# Patient Record
Sex: Male | Born: 1990 | Race: White | Hispanic: No | Marital: Single | Smoking: Current every day smoker
Health system: Southern US, Community
[De-identification: ages and names within clinical notes are randomized; demographics above are authoritative.]

## PROBLEM LIST (undated history)

## (undated) DIAGNOSIS — F1910 Other psychoactive substance abuse, uncomplicated: Secondary | ICD-10-CM

## (undated) HISTORY — DX: Other psychoactive substance abuse, uncomplicated: F19.10

---

## 2011-07-18 ENCOUNTER — Emergency Department (HOSPITAL_COMMUNITY): Payer: Self-pay

## 2011-07-18 ENCOUNTER — Emergency Department (HOSPITAL_COMMUNITY)
Admission: EM | Admit: 2011-07-18 | Discharge: 2011-07-18 | Disposition: A | Discharge status: Home / Self Care | Payer: Self-pay | Attending: Emergency Medicine | Admitting: Emergency Medicine

## 2011-07-18 DIAGNOSIS — R609 Edema, unspecified: Secondary | ICD-10-CM | POA: Insufficient documentation

## 2011-07-18 DIAGNOSIS — F17200 Nicotine dependence, unspecified, uncomplicated: Secondary | ICD-10-CM | POA: Insufficient documentation

## 2011-07-18 DIAGNOSIS — IMO0002 Reserved for concepts with insufficient information to code with codable children: Secondary | ICD-10-CM | POA: Insufficient documentation

## 2011-07-18 DIAGNOSIS — S60229A Contusion of unspecified hand, initial encounter: Secondary | ICD-10-CM | POA: Insufficient documentation

## 2011-07-18 DIAGNOSIS — M25549 Pain in joints of unspecified hand: Secondary | ICD-10-CM | POA: Insufficient documentation

## 2011-07-18 DIAGNOSIS — M79609 Pain in unspecified limb: Secondary | ICD-10-CM | POA: Insufficient documentation

## 2011-07-18 MED ORDER — IBUPROFEN 800 MG PO TABS: 800.0000 mg | ORAL_TABLET | Freq: Three times a day (TID) | ORAL | Status: AC

## 2011-07-18 NOTE — ED Notes (Signed)
Applied ace wrap to right hand. Patient tolerated well. Gave instructions on applying ace wrap as needed.

## 2011-07-18 NOTE — ED Notes (Signed)
Pt presents with right hand pain. Pt states he was working on his truck and he ran his hand into the motor.

## 2011-07-20 NOTE — ED Provider Notes (Signed)
History     CSN: [id number]  Arrival date & time 07/18/11  1825   First MD Initiated Contact with Patient 07/18/11 1839      Chief Complaint  Patient presents with  . Hand Injury    (Consider location/radiation/quality/duration/timing/severity/associated sxs/prior treatment) Patient is a 21 y.o. male presenting with hand injury. The history is provided by the patient.  Hand Injury  The incident occurred yesterday. The incident occurred at home. The injury mechanism was a direct blow (He was working on his truck yesterday,  a tool slipped and he hit the right hand against the motor.). The pain is present in the right hand. The quality of the pain is described as aching. The pain is at a severity of 8/10. The pain is moderate. The pain has been constant since the incident. Pertinent negatives include no fever. He reports no foreign bodies present. The symptoms are aggravated by movement and palpation. He has tried ice for the symptoms. The treatment provided no relief.    History reviewed. No pertinent past medical history.  History reviewed. No pertinent past surgical history.  No family history on file.  History  Substance Use Topics  . Smoking status: Current Everyday Smoker -- 0.5 packs/day  . Smokeless tobacco: Not on file  . Alcohol Use: No      Review of Systems  Constitutional: Negative for fever.  HENT: Negative.  Negative for sore throat.   Eyes: Negative.   Respiratory: Negative for shortness of breath.   Cardiovascular: Negative for chest pain.  Gastrointestinal: Negative for nausea and abdominal pain.  Genitourinary: Negative.   Musculoskeletal: Positive for arthralgias. Negative for joint swelling.  Skin: Negative.  Negative for rash and wound.  Neurological: Negative for weakness, numbness and headaches.  Hematological: Negative.   Psychiatric/Behavioral: Negative.     Allergies  Review of patient's allergies indicates no known allergies.  Home  Medications   Current Outpatient Rx  Name Route Sig Dispense Refill  . IBUPROFEN 800 MG PO TABS Oral Take 1 tablet (800 mg total) by mouth 3 (three) times daily. Take this medicine with food 21 tablet 0    BP 149/75  Pulse 87  Temp(Src) 97.7 F (36.5 C) (Oral)  Resp 18  Ht 6' (1.829 m)  Wt 200 lb (90.719 kg)  BMI 27.12 kg/m2  SpO2 100%  Physical Exam  Nursing note and vitals reviewed. Constitutional: He is oriented to person, place, and time. He appears well-developed and well-nourished.  HENT:  Head: Normocephalic and atraumatic.  Eyes: Conjunctivae are normal.  Neck: Normal range of motion.  Cardiovascular: Normal rate and intact distal pulses.   Pulmonary/Chest: Effort normal.  Musculoskeletal: Normal range of motion.       Hands: Neurological: He is alert and oriented to person, place, and time.  Skin: Skin is warm and dry.       Minimal edema noted dorsal mid hand over 5th mcp.  Psychiatric: He has a normal mood and affect.    ED Course  Procedures (including critical care time)  Labs Reviewed - No data to display Dg Hand Complete Right  07/18/2011  *RADIOLOGY REPORT*  Clinical Data: Pain post injury  RIGHT HAND - COMPLETE 3+ VIEW  Comparison: None.  Findings: Three views of the right hand submitted.  No acute fracture or subluxation.  No radiopaque foreign body.  IMPRESSION: No acute fracture or subluxation.  Original Report Authenticated By: Natasha Mead, M.D.     1. Hand contusion  MDM  Contusion of right hand.   Ace wrap,  Ibuprofen,  Ice.        Candis Musa, PA 07/20/11 1502

## 2011-07-21 NOTE — ED Provider Notes (Signed)
Medical screening examination/treatment/procedure(s) were performed by non-physician practitioner and as supervising physician I was immediately available for consultation/collaboration.  Nicoletta Dress. Colon Branch, MD 07/21/11 1527

## 2016-06-04 ENCOUNTER — Emergency Department (HOSPITAL_COMMUNITY)
Admission: EM | Admit: 2016-06-04 | Discharge: 2016-06-05 | Disposition: A | Discharge status: Home / Self Care | Payer: Self-pay | Attending: Emergency Medicine | Admitting: Emergency Medicine

## 2016-06-04 ENCOUNTER — Encounter (HOSPITAL_COMMUNITY): Payer: Self-pay | Admitting: Emergency Medicine

## 2016-06-04 DIAGNOSIS — F17210 Nicotine dependence, cigarettes, uncomplicated: Secondary | ICD-10-CM | POA: Insufficient documentation

## 2016-06-04 DIAGNOSIS — K0889 Other specified disorders of teeth and supporting structures: Secondary | ICD-10-CM

## 2016-06-04 DIAGNOSIS — K029 Dental caries, unspecified: Secondary | ICD-10-CM | POA: Insufficient documentation

## 2016-06-04 NOTE — ED Triage Notes (Signed)
Pt states he started having left jaw pain for 3 days

## 2016-06-04 NOTE — ED Provider Notes (Signed)
[facility]EM[facility]PT Provider Note   CSN: [id number] Arrival date & time: 06/04/16  2342     History   Chief Complaint Chief Complaint  Patient presents with  . Dental Pain    HPI Drew Hammond is a 25 y.o. male.  HPI   Drew Hammond is a 25 y.o. male who presents to the Emergency Department complaining of dental pain for nearly one week.  Pain increased for 3 days.  He reports pain to the left upper tooth that is worse with chewing and exposure to cold air.  He has been taking ibuprofen with minimal relief.  He has also been using an OTC temporary filling product. He denies fever, neck pain, facial swelling or difficulty swallowing.    History reviewed. No pertinent past medical history.  There are no active problems to display for this patient.   History reviewed. No pertinent surgical history.     Home Medications    Prior to Admission medications   Not on File    Family History History reviewed. No pertinent family history.  Social History Social History  Substance Use Topics  . Smoking status: Current Every Day Smoker    Packs/day: 0.50  . Smokeless tobacco: Former NeurosurgeonUser  . Alcohol use No     Allergies   Patient has no known allergies.   Review of Systems Review of Systems  Constitutional: Negative for appetite change and fever.  HENT: Positive for dental problem. Negative for congestion, facial swelling, sore throat and trouble swallowing.   Eyes: Negative for pain.  Musculoskeletal: Negative for neck pain and neck stiffness.  Neurological: Negative for dizziness, facial asymmetry and headaches.  Hematological: Negative for adenopathy.  All other systems reviewed and are negative.    Physical Exam Updated Vital Signs BP 145/90 (BP Location: Left Arm)   Pulse 77   Temp 98.3 F (36.8 C) (Oral)   Resp 18   Ht 6' (1.829 m)   Wt 88.5 kg   SpO2 100%   BMI 26.45 kg/m   Physical Exam  Constitutional: He is oriented to person, place,  and time. He appears well-developed and well-nourished. No distress.  HENT:  Head: Normocephalic and atraumatic.  Right Ear: Tympanic membrane and ear canal normal.  Left Ear: Tympanic membrane and ear canal normal.  Mouth/Throat: Uvula is midline, oropharynx is clear and moist and mucous membranes are normal. No trismus in the jaw. Dental caries present. No dental abscesses or uvula swelling.  Tenderness and dental caries of the left upper second molar.  No facial swelling, obvious dental abscess, trismus, or sublingual abnml.    Neck: Normal range of motion. Neck supple.  Cardiovascular: Normal rate, regular rhythm and normal heart sounds.   No murmur heard. Pulmonary/Chest: Effort normal and breath sounds normal.  Musculoskeletal: Normal range of motion.  Lymphadenopathy:    He has no cervical adenopathy.  Neurological: He is alert and oriented to person, place, and time. He exhibits normal muscle tone. Coordination normal.  Skin: Skin is warm and dry.  Nursing note and vitals reviewed.    ED Treatments / Results  Labs (all labs ordered are listed, but only abnormal results are displayed) Labs Reviewed - No data to display  EKG  EKG Interpretation None       Radiology No results found.  Procedures Procedures (including critical care time)  Medications Ordered in ED Medications - No data to display   Initial Impression / Assessment and Plan / ED Course  I have reviewed the triage vital signs and the nursing notes.  Pertinent labs & imaging results that were available during my care of the patient were reviewed by me and considered in my medical decision making (see chart for details).  Clinical Course     Pt is well appearing.  Vitals stable.  Airway patent.  No concerning sx's for dental abscess or Lawanda CousinsLudwig' angina.  Dental referral info given.  Pt agrees to arrange f/u.     Final Clinical Impressions(s) / ED Diagnoses   Final diagnoses:  Pain, dental     New Prescriptions New Prescriptions   No medications on file     Pauline Ausammy Yliana Gravois, PA-C 06/05/16 0007    Layla MawKristen N Ward, DO 06/05/16 [id number]

## 2016-06-05 MED ORDER — DICLOFENAC SODIUM 75 MG PO TBEC
75.0000 mg | DELAYED_RELEASE_TABLET | Freq: Two times a day (BID) | ORAL | 0 refills | Status: AC
Start: 2016-06-05 — End: ?

## 2016-06-05 MED ORDER — AMOXICILLIN 250 MG PO CAPS
500.0000 mg | ORAL_CAPSULE | Freq: Once | ORAL | Status: AC
  Administered 2016-06-05: 500 mg via ORAL
  Filled 2016-06-05: qty 2

## 2016-06-05 MED ORDER — AMOXICILLIN 500 MG PO CAPS: 500.0000 mg | ORAL_CAPSULE | Freq: Three times a day (TID) | ORAL | 0 refills | Status: DC

## 2016-06-05 MED ORDER — HYDROCODONE-ACETAMINOPHEN 5-325 MG PO TABS
1.0000 | ORAL_TABLET | Freq: Once | ORAL | Status: AC
  Administered 2016-06-05: 1 via ORAL
  Filled 2016-06-05: qty 1

## 2016-06-05 NOTE — Discharge Instructions (Signed)
Call one of the dentists on the list provided to arrange a follow-up appt.   °

## 2016-06-05 NOTE — ED Notes (Signed)
Pt states understanding of care given and follow up instructions.  Pt A/O x4, ambulated with steady gait from ED

## 2016-07-12 ENCOUNTER — Encounter (HOSPITAL_COMMUNITY): Payer: Self-pay

## 2016-07-12 ENCOUNTER — Emergency Department (HOSPITAL_COMMUNITY): Payer: Self-pay

## 2016-07-12 ENCOUNTER — Emergency Department (HOSPITAL_COMMUNITY)
Admission: EM | Admit: 2016-07-12 | Discharge: 2016-07-12 | Disposition: A | Discharge status: Home / Self Care | Payer: Self-pay | Attending: Emergency Medicine | Admitting: Emergency Medicine

## 2016-07-12 DIAGNOSIS — J3489 Other specified disorders of nose and nasal sinuses: Secondary | ICD-10-CM | POA: Insufficient documentation

## 2016-07-12 DIAGNOSIS — R51 Headache: Secondary | ICD-10-CM | POA: Insufficient documentation

## 2016-07-12 DIAGNOSIS — R509 Fever, unspecified: Secondary | ICD-10-CM | POA: Insufficient documentation

## 2016-07-12 DIAGNOSIS — R05 Cough: Secondary | ICD-10-CM | POA: Insufficient documentation

## 2016-07-12 DIAGNOSIS — M542 Cervicalgia: Secondary | ICD-10-CM | POA: Insufficient documentation

## 2016-07-12 DIAGNOSIS — R519 Headache, unspecified: Secondary | ICD-10-CM

## 2016-07-12 DIAGNOSIS — F17200 Nicotine dependence, unspecified, uncomplicated: Secondary | ICD-10-CM | POA: Insufficient documentation

## 2016-07-12 MED ORDER — CETIRIZINE-PSEUDOEPHEDRINE ER 5-120 MG PO TB12: 1.0000 | ORAL_TABLET | Freq: Two times a day (BID) | ORAL | 0 refills | Status: DC

## 2016-07-12 MED ORDER — HYDROCODONE-ACETAMINOPHEN 5-325 MG PO TABS: 1.0000 | ORAL_TABLET | ORAL | 0 refills | Status: DC | PRN

## 2016-07-12 MED ORDER — ONDANSETRON 8 MG PO TBDP
8.0000 mg | ORAL_TABLET | Freq: Once | ORAL | Status: AC
  Administered 2016-07-12: 8 mg via ORAL
  Filled 2016-07-12: qty 1

## 2016-07-12 MED ORDER — HYDROCODONE-ACETAMINOPHEN 5-325 MG PO TABS
1.0000 | ORAL_TABLET | Freq: Once | ORAL | Status: AC
  Administered 2016-07-12: 1 via ORAL
  Filled 2016-07-12: qty 1

## 2016-07-12 MED ORDER — KETOROLAC TROMETHAMINE 30 MG/ML IJ SOLN
30.0000 mg | Freq: Once | INTRAMUSCULAR | Status: AC
  Administered 2016-07-12: 30 mg via INTRAMUSCULAR
  Filled 2016-07-12: qty 1

## 2016-07-12 NOTE — ED Notes (Addendum)
Pt taken to xray 

## 2016-07-12 NOTE — ED Notes (Signed)
Pt taken to CT.

## 2016-07-12 NOTE — ED Triage Notes (Signed)
Patient reports of nasal congestion, cough, fever x1 week. States last night his head started to hurt and has not had any relief. Taken tylenol.

## 2016-07-12 NOTE — Discharge Instructions (Signed)
Take the medicines prescribed. Do not drive within 4 hours of taking hydrocodone as this medicine will make you sleepy.

## 2016-07-12 NOTE — ED Notes (Signed)
Pt returned from xray

## 2016-07-12 NOTE — ED Provider Notes (Signed)
[facility] Provider Note   CSN: [id number] Arrival date & time: 07/12/16  1548     History   Chief Complaint Chief Complaint  Patient presents with  . Headache  . Nasal Congestion    HPI Drew Hammond is a 25 y.o. male presenting with severe frontal headache, chills and subjective fever since last night.  He endorses having a one week history of nasal congestion with both clear rhinorrhea and intermittent thicker yellow discharge.  He denies epistaxis or bloody discharge.  He has had a nonproductive cough, denies shortness of breath, dizziness, vision changes but does endorse photophobia.  He denies prior history of headache.  He has become nauseous since here. He has neck and shoulder achy soreness, denies stiff neck.  He has taken tylenol without relief.  The history is provided by the patient.    History reviewed. No pertinent past medical history.  There are no active problems to display for this patient.   History reviewed. No pertinent surgical history.     Home Medications    Prior to Admission medications   Medication Sig Start Date End Date Taking? Authorizing Provider  amoxicillin (AMOXIL) 500 MG capsule Take 1 capsule (500 mg total) by mouth 3 (three) times daily. 06/05/16   Tammy Triplett, PA-C  cetirizine-pseudoephedrine (ZYRTEC-D) 5-120 MG tablet Take 1 tablet by mouth 2 (two) times daily. 07/12/16   Burgess Amor, PA-C  diclofenac (VOLTAREN) 75 MG EC tablet Take 1 tablet (75 mg total) by mouth 2 (two) times daily. Take with food 06/05/16   Tammy Triplett, PA-C  HYDROcodone-acetaminophen (NORCO/VICODIN) 5-325 MG tablet Take 1 tablet by mouth every 4 (four) hours as needed. 07/12/16   Burgess Amor, PA-C    Family History No family history on file.  Social History Social History  Substance Use Topics  . Smoking status: Current Every Day Smoker    Packs/day: 0.50  . Smokeless tobacco: Former Neurosurgeon  . Alcohol use No     Allergies   Patient has  no known allergies.   Review of Systems Review of Systems  Constitutional: Positive for chills and fever.  HENT: Positive for congestion and rhinorrhea. Negative for ear pain, sinus pressure, sore throat, trouble swallowing and voice change.   Eyes: Negative for discharge.  Respiratory: Positive for cough. Negative for shortness of breath, wheezing and stridor.   Cardiovascular: Negative for chest pain.  Gastrointestinal: Negative for abdominal pain.  Genitourinary: Negative.   Musculoskeletal: Positive for neck pain.  Skin: Negative.   Neurological: Positive for headaches. Negative for dizziness, speech difficulty and numbness.     Physical Exam Updated Vital Signs BP 121/64   Pulse 82   Temp 98.2 F (36.8 C) (Oral)   Resp 18   Ht 6' (1.829 m)   Wt 90.7 kg   SpO2 97%   BMI 27.12 kg/m   Physical Exam  Constitutional: He is oriented to person, place, and time. He appears well-developed and well-nourished.  HENT:  Head: Normocephalic and atraumatic.  Right Ear: Tympanic membrane and ear canal normal.  Left Ear: Tympanic membrane and ear canal normal.  Nose: Mucosal edema present. No rhinorrhea. No epistaxis.  Mouth/Throat: Uvula is midline, oropharynx is clear and moist and mucous membranes are normal. No oropharyngeal exudate, posterior oropharyngeal edema, posterior oropharyngeal erythema or tonsillar abscesses.  ttp bilateral forehead over frontal sinus.  No edema or erythema    Eyes: Conjunctivae and EOM are normal. Pupils are equal, round, and reactive to light.  Neck: Trachea normal and normal range of motion. Neck supple. Muscular tenderness present. No neck rigidity. Normal range of motion present.  Cardiovascular: Normal rate and normal heart sounds.   Pulmonary/Chest: Effort normal. No respiratory distress. He has no decreased breath sounds. He has wheezes in the right lower field. He has no rhonchi. He has no rales.  Occasional expiratory wheeze right base.    Abdominal: Soft. There is no tenderness.  Musculoskeletal: Normal range of motion.  Neurological: He is alert and oriented to person, place, and time. He has normal strength. No cranial nerve deficit or sensory deficit. Gait normal. GCS eye subscore is 4. GCS verbal subscore is 5. GCS motor subscore is 6.  Skin: Skin is warm and dry. No rash noted.  Psychiatric: He has a normal mood and affect.  Vitals reviewed.    ED Treatments / Results  Labs (all labs ordered are listed, but only abnormal results are displayed) Labs Reviewed - No data to display  EKG  EKG Interpretation None       Radiology Dg Chest 2 View  Result Date: 07/12/2016 CLINICAL DATA:  Productive cough and wheezing for 1 week.  Headache. EXAM: CHEST  2 VIEW COMPARISON:  None. FINDINGS: Heart and mediastinal contours are within normal limits. No focal opacities or effusions. No acute bony abnormality. IMPRESSION: No active cardiopulmonary disease. Electronically Signed   By: Charlett NoseKevin  Dover M.D.   On: 07/12/2016 16:41   Ct Maxillofacial Wo Contrast  Result Date: 07/12/2016 CLINICAL DATA:  Nasal congestion, cough, fever for 1 week. Frontal headache. EXAM: CT MAXILLOFACIAL WITHOUT CONTRAST TECHNIQUE: Multidetector CT imaging of the maxillofacial structures was performed. Multiplanar CT image reconstructions were also generated. A small metallic BB was placed on the right temple in order to reliably differentiate right from left. COMPARISON:  None. FINDINGS: Osseous: No fracture or mandibular dislocation. No destructive process. Orbits: Negative. No traumatic or inflammatory finding. Sinuses: Mild mucosal thickening in the maxillary sinuses and scattered ethmoid air cells. No air-fluid levels. Mastoid air cells are clear. Soft tissues: Normal Limited intracranial: Negative IMPRESSION: Mild chronic sinusitis changes in the maxillary sinuses and scattered ethmoid air cells. No acute sinusitis changes. No acute bony abnormality.  Electronically Signed   By: Charlett NoseKevin  Dover M.D.   On: 07/12/2016 18:31    Procedures Procedures (including critical care time)  Medications Ordered in ED Medications  ondansetron (ZOFRAN-ODT) disintegrating tablet 8 mg (8 mg Oral Given 07/12/16 1724)  HYDROcodone-acetaminophen (NORCO/VICODIN) 5-325 MG per tablet 1 tablet (1 tablet Oral Given 07/12/16 1724)  ketorolac (TORADOL) 30 MG/ML injection 30 mg (30 mg Intramuscular Given 07/12/16 1724)     Initial Impression / Assessment and Plan / ED Course  I have reviewed the triage vital signs and the nursing notes.  Pertinent labs & imaging results that were available during my care of the patient were reviewed by me and considered in my medical decision making (see chart for details).  Clinical Course     Suspect viral uri with sinus headache, exam not c/w meningitis. nonfocal neuro exam.  Ct maxillofacial obtained given patients discomfort level and persistence of sx.  No acute sinusitis.  He was given hydrocodone and toradol injection here with improvement in sx.  Advised continue anti inflammatory, also prescribed antihistamine/decongestant, few hydrocodone tablets for pain relief.  Discussed warm compresses, steam tx. Recheck by pcp if sx persist.  The patient appears reasonably screened and/or stabilized for discharge and I doubt any other medical condition or other [facility]  requiring further screening, evaluation, or treatment in the ED at this time prior to discharge.   Final Clinical Impressions(s) / ED Diagnoses   Final diagnoses:  Sinus headache    New Prescriptions Discharge Medication List as of 07/12/2016  8:06 PM    START taking these medications   Details  cetirizine-pseudoephedrine (ZYRTEC-D) 5-120 MG tablet Take 1 tablet by mouth 2 (two) times daily., Starting Wed 07/12/2016, Print    HYDROcodone-acetaminophen (NORCO/VICODIN) 5-325 MG tablet Take 1 tablet by mouth every 4 (four) hours as needed., Starting Wed 07/12/2016,  Print         Burgess AmorJulie Cynthia Stainback, PA-C 07/13/16 1358    Vanetta MuldersScott Zackowski, MD 07/17/16 1659

## 2016-07-12 NOTE — ED Notes (Signed)
Returned from ct 

## 2016-07-13 ENCOUNTER — Emergency Department (HOSPITAL_COMMUNITY)
Admission: EM | Admit: 2016-07-13 | Discharge: 2016-07-13 | Disposition: A | Discharge status: Home / Self Care | Payer: Self-pay | Attending: Emergency Medicine | Admitting: Emergency Medicine

## 2016-07-13 ENCOUNTER — Encounter (HOSPITAL_COMMUNITY): Payer: Self-pay | Admitting: *Deleted

## 2016-07-13 DIAGNOSIS — R112 Nausea with vomiting, unspecified: Secondary | ICD-10-CM | POA: Insufficient documentation

## 2016-07-13 DIAGNOSIS — R51 Headache: Secondary | ICD-10-CM | POA: Insufficient documentation

## 2016-07-13 DIAGNOSIS — R0981 Nasal congestion: Secondary | ICD-10-CM | POA: Insufficient documentation

## 2016-07-13 DIAGNOSIS — R519 Headache, unspecified: Secondary | ICD-10-CM

## 2016-07-13 DIAGNOSIS — F17200 Nicotine dependence, unspecified, uncomplicated: Secondary | ICD-10-CM | POA: Insufficient documentation

## 2016-07-13 DIAGNOSIS — H53149 Visual discomfort, unspecified: Secondary | ICD-10-CM | POA: Insufficient documentation

## 2016-07-13 MED ORDER — METOCLOPRAMIDE HCL 5 MG/ML IJ SOLN
10.0000 mg | Freq: Once | INTRAMUSCULAR | Status: AC
  Administered 2016-07-13: 10 mg via INTRAMUSCULAR
  Filled 2016-07-13: qty 2

## 2016-07-13 MED ORDER — DIPHENHYDRAMINE HCL 25 MG PO CAPS
25.0000 mg | ORAL_CAPSULE | Freq: Once | ORAL | Status: AC
  Administered 2016-07-13: 25 mg via ORAL
  Filled 2016-07-13: qty 1

## 2016-07-13 MED ORDER — KETOROLAC TROMETHAMINE 60 MG/2ML IM SOLN
60.0000 mg | Freq: Once | INTRAMUSCULAR | Status: AC
  Administered 2016-07-13: 60 mg via INTRAMUSCULAR
  Filled 2016-07-13: qty 2

## 2016-07-13 NOTE — ED Provider Notes (Signed)
[facility]-EMERGENCY DEPT Provider Note   CSN: [id number] Arrival date & time: 07/13/16  0734     History   Chief Complaint Chief Complaint  Patient presents with  . Headache    HPI Drew Hammond is a 25 y.o. male.  HPI  Drew Hammond is a 25 y.o. male who presents to the Emergency Department complaining of continued frontal headache.  He reports having a headache of gradual onset beginning two days ago.  He was seen here one day prior to this visit and treated with medication.  He reports headache had improved, but returned shortly after being discharged.  He returns complaining of throbbing pain to his forehead, temples and behind his eyes.  Pain associated with nasal congestion, bright light, nausea and vomiting.  He denies neck stiffness, fever, dizziness.  He has taken tylenol without relief. He was prescribed medications on the previous visit, but has not gotten them filled.    History reviewed. No pertinent past medical history.  There are no active problems to display for this patient.   History reviewed. No pertinent surgical history.     Home Medications    Prior to Admission medications   Medication Sig Start Date End Date Taking? Authorizing Provider  amoxicillin (AMOXIL) 500 MG capsule Take 1 capsule (500 mg total) by mouth 3 (three) times daily. 06/05/16   Alijah Akram, PA-C  cetirizine-pseudoephedrine (ZYRTEC-D) 5-120 MG tablet Take 1 tablet by mouth 2 (two) times daily. 07/12/16   Burgess AmorJulie Idol, PA-C  diclofenac (VOLTAREN) 75 MG EC tablet Take 1 tablet (75 mg total) by mouth 2 (two) times daily. Take with food 06/05/16   Calder Oblinger, PA-C  HYDROcodone-acetaminophen (NORCO/VICODIN) 5-325 MG tablet Take 1 tablet by mouth every 4 (four) hours as needed. 07/12/16   Burgess AmorJulie Idol, PA-C    Family History No family history on file.  Social History Social History  Substance Use Topics  . Smoking status: Current Every Day Smoker    Packs/day: 0.50  . Smokeless  tobacco: Former NeurosurgeonUser  . Alcohol use No     Allergies   Patient has no known allergies.   Review of Systems Review of Systems  Constitutional: Negative for activity change, appetite change and fever.  HENT: Negative for facial swelling and trouble swallowing.   Eyes: Positive for photophobia. Negative for pain and visual disturbance.  Respiratory: Negative for shortness of breath.   Cardiovascular: Negative for chest pain.  Gastrointestinal: Negative for nausea and vomiting.  Musculoskeletal: Negative for neck pain and neck stiffness.  Skin: Negative for rash and wound.  Neurological: Positive for headaches. Negative for dizziness, facial asymmetry, speech difficulty, weakness and numbness.  Psychiatric/Behavioral: Negative for confusion and decreased concentration.  All other systems reviewed and are negative.    Physical Exam Updated Vital Signs BP 138/81 (BP Location: Left Arm)   Pulse 70   Temp 98.1 F (36.7 C) (Oral)   Resp 18   Ht 6' (1.829 m)   Wt 90.7 kg   SpO2 100%   BMI 27.12 kg/m   Physical Exam  Constitutional: He is oriented to person, place, and time. He appears well-developed and well-nourished. No distress.  HENT:  Head: Normocephalic and atraumatic.  Nose: Mucosal edema present. Right sinus exhibits frontal sinus tenderness. Left sinus exhibits frontal sinus tenderness.  Mouth/Throat: Uvula is midline, oropharynx is clear and moist and mucous membranes are normal.  Eyes: EOM are normal. Pupils are equal, round, and reactive to light.  Neck: Normal range  of motion and phonation normal. Neck supple. No spinous process tenderness and no muscular tenderness present. No neck rigidity. No Kernig's sign noted.  Cardiovascular: Normal rate, regular rhythm and intact distal pulses.   No murmur heard. Pulmonary/Chest: Effort normal and breath sounds normal. No respiratory distress.  Musculoskeletal: Normal range of motion.  Neurological: He is alert and  oriented to person, place, and time. He has normal strength. No cranial nerve deficit or sensory deficit. He exhibits normal muscle tone. Coordination and gait normal. GCS eye subscore is 4. GCS verbal subscore is 5. GCS motor subscore is 6.  Reflex Scores:      Tricep reflexes are 2+ on the right side and 2+ on the left side.      Bicep reflexes are 2+ on the right side and 2+ on the left side. Skin: Skin is warm and dry.  Psychiatric: He has a normal mood and affect.  Nursing note and vitals reviewed.    ED Treatments / Results  Labs (all labs ordered are listed, but only abnormal results are displayed) Labs Reviewed - No data to display  EKG  EKG Interpretation None       Radiology Dg Chest 2 View  Result Date: 07/12/2016 CLINICAL DATA:  Productive cough and wheezing for 1 week.  Headache. EXAM: CHEST  2 VIEW COMPARISON:  None. FINDINGS: Heart and mediastinal contours are within normal limits. No focal opacities or effusions. No acute bony abnormality. IMPRESSION: No active cardiopulmonary disease. Electronically Signed   By: Charlett NoseKevin  Dover M.D.   On: 07/12/2016 16:41   Ct Maxillofacial Wo Contrast  Result Date: 07/12/2016 CLINICAL DATA:  Nasal congestion, cough, fever for 1 week. Frontal headache. EXAM: CT MAXILLOFACIAL WITHOUT CONTRAST TECHNIQUE: Multidetector CT imaging of the maxillofacial structures was performed. Multiplanar CT image reconstructions were also generated. A small metallic BB was placed on the right temple in order to reliably differentiate right from left. COMPARISON:  None. FINDINGS: Osseous: No fracture or mandibular dislocation. No destructive process. Orbits: Negative. No traumatic or inflammatory finding. Sinuses: Mild mucosal thickening in the maxillary sinuses and scattered ethmoid air cells. No air-fluid levels. Mastoid air cells are clear. Soft tissues: Normal Limited intracranial: Negative IMPRESSION: Mild chronic sinusitis changes in the maxillary  sinuses and scattered ethmoid air cells. No acute sinusitis changes. No acute bony abnormality. Electronically Signed   By: Charlett NoseKevin  Dover M.D.   On: 07/12/2016 18:31    Procedures Procedures (including critical care time)  Medications Ordered in ED Medications  ketorolac (TORADOL) injection 60 mg (60 mg Intramuscular Given 07/13/16 0928)  metoCLOPramide (REGLAN) injection 10 mg (10 mg Intramuscular Given 07/13/16 0929)  diphenhydrAMINE (BENADRYL) capsule 25 mg (25 mg Oral Given 07/13/16 [id number])     Initial Impression / Assessment and Plan / ED Course  I have reviewed the triage vital signs and the nursing notes.  Pertinent labs & imaging results that were available during my care of the patient were reviewed by me and considered in my medical decision making (see chart for details).  Clinical Course    Well appearing, non-toxic.  No nuchal rigidity.  CT maxillofacial from yesterday neg for acute sinusitis.    Pt is feeling better after medications.  Drinking fluids and sleeping on recheck.  Easily aroused.  Reports pain improved.  Agrees to get rx filled today  Final Clinical Impressions(s) / ED Diagnoses   Final diagnoses:  Sinus headache    New Prescriptions New Prescriptions   No medications on file  Pauline Aus, PA-C 07/14/16 2126    Donnetta Hutching, MD 07/17/16 [phone]

## 2016-07-13 NOTE — Discharge Instructions (Signed)
Try to get your medications filled today.  Drink plenty of fluids.  Follow-up with your doctor for recheck if needed.

## 2016-07-13 NOTE — ED Notes (Signed)
Patient given discharge instruction, verbalized understand. Patient ambulatory out of the department.  

## 2016-07-13 NOTE — ED Triage Notes (Signed)
Pt comes in with headache starting 2 days ago. Pt has had n/v with this. Was seen here last night and discharged. States on the way home his head began hurting again. NAD noted.

## 2017-09-25 IMAGING — CT CT MAXILLOFACIAL W/O CM
3 series · 16 of 47 positions shown, 19 images · non-contrast
Comparison: None.

CLINICAL DATA: Nasal congestion, cough, fever for 1 week. Frontal
headache.

EXAM:
CT MAXILLOFACIAL WITHOUT CONTRAST
TECHNIQUE: Multidetector CT imaging of the maxillofacial structures was
performed. Multiplanar CT image reconstructions were also generated.
A small metallic BB was placed on the right temple in order to
reliably differentiate right from left.

[Series 2: max soft · axial · 0.34mm/px · z∈[+921,+1071]mm · 10 of 87 slices shown, 13 images]
[im 6/87  brain]
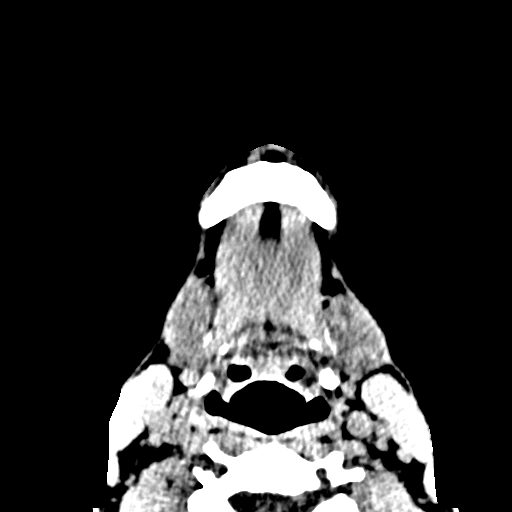
[im 6/87  bone]
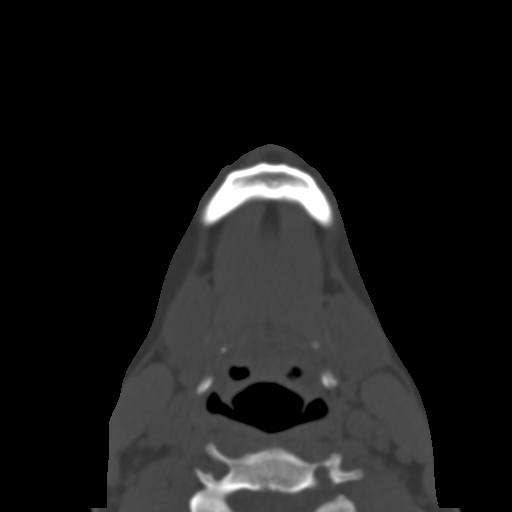
[im 15/87  bone]
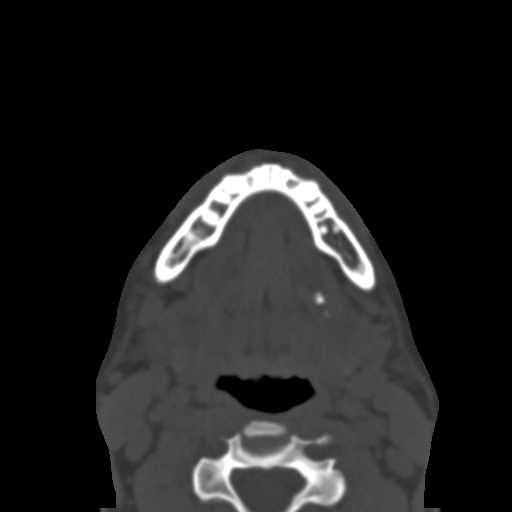
[im 24/87  bone]
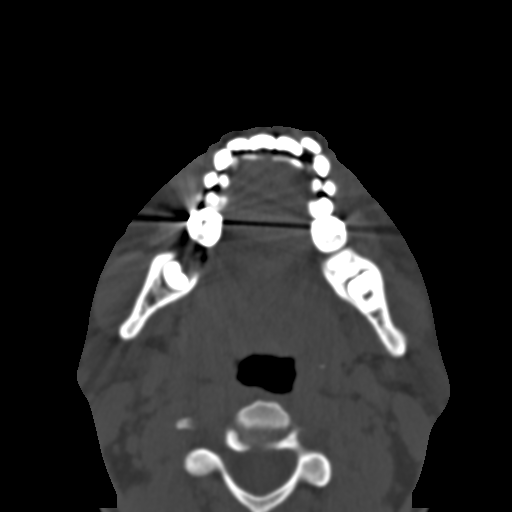
[im 30/87  bone]
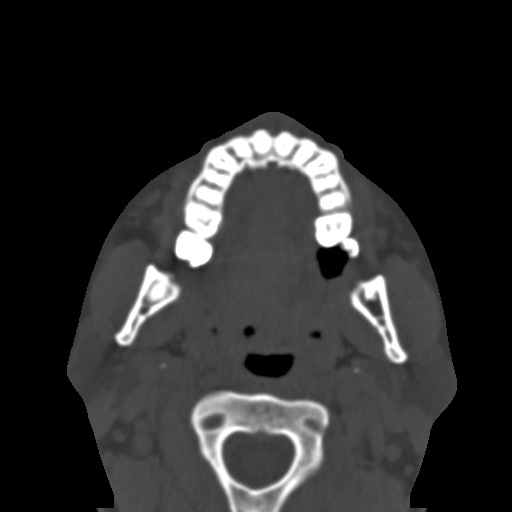
[im 39/87  brain]
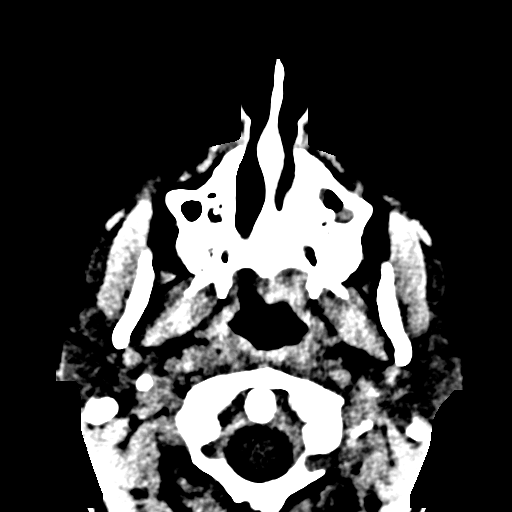
[im 39/87  bone]
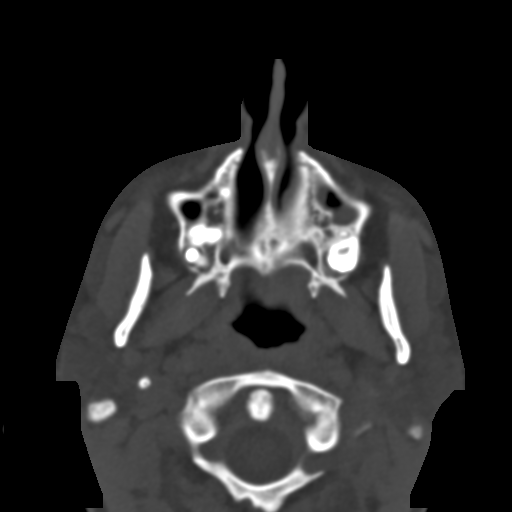
[im 48/87  bone]
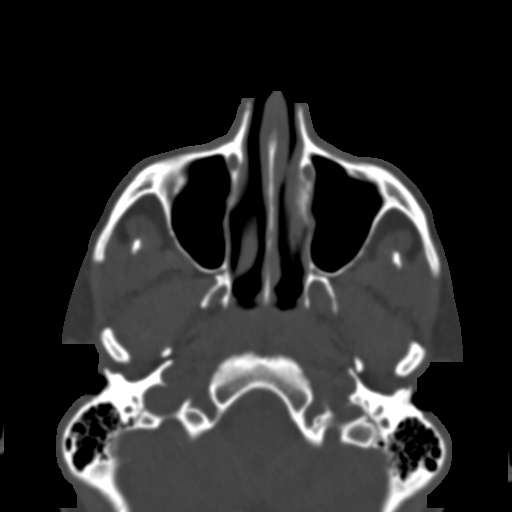
[im 57/87  bone]
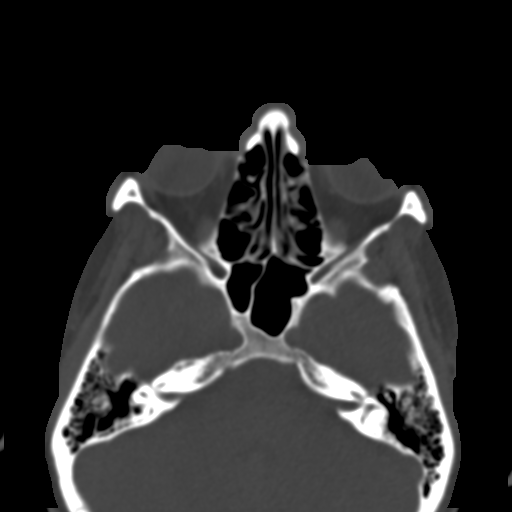
[im 66/87  bone]
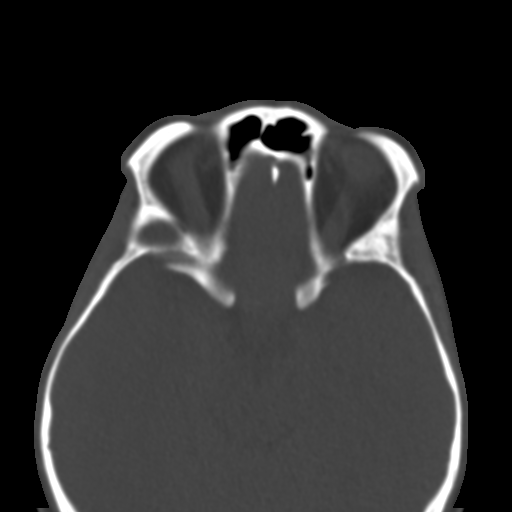
[im 72/87  brain]
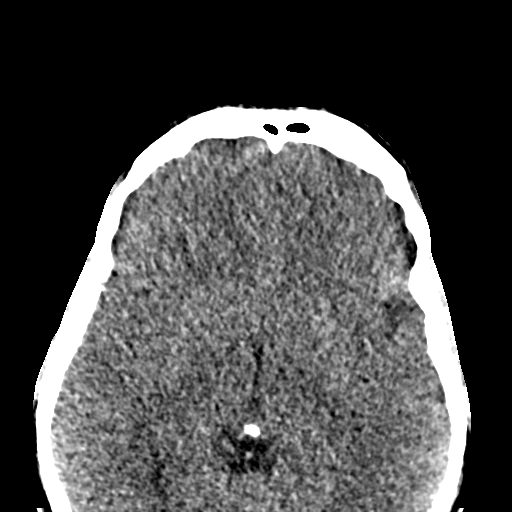
[im 72/87  bone]
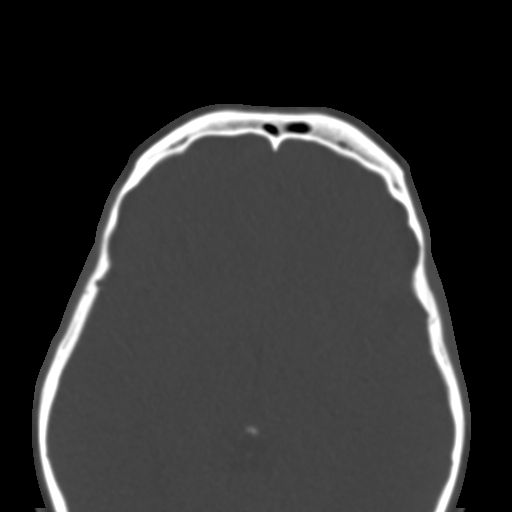
[im 81/87  bone]
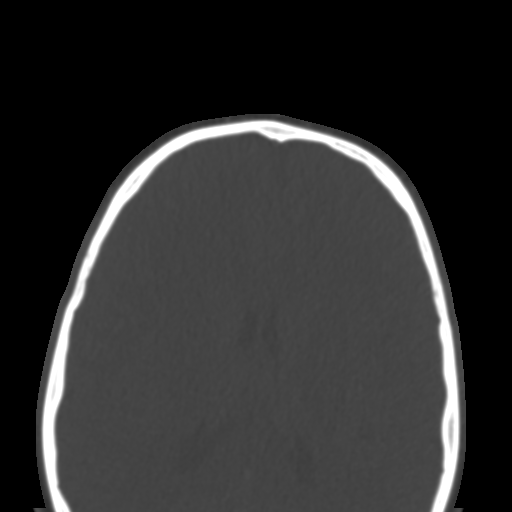

[Series 6: coronal soft · coronal · 0.31mm/px · 3 of 72 slices shown]
[im 24/72  bone]
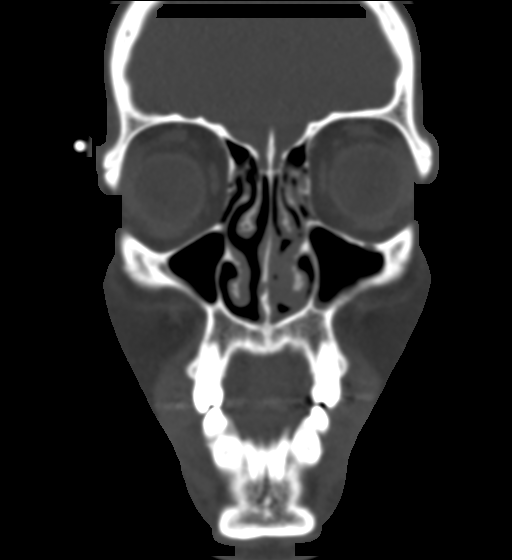
[im 32/72  bone]
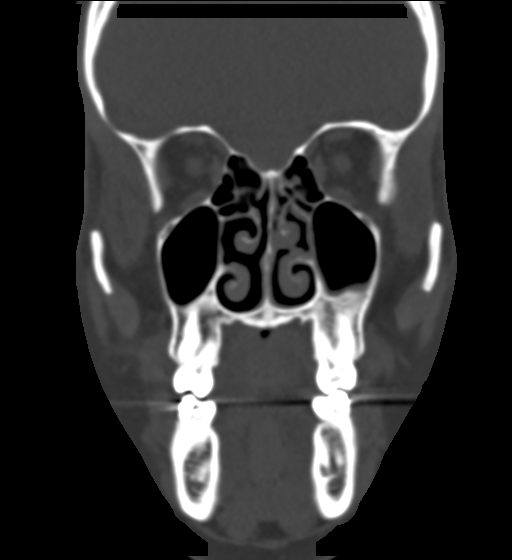
[im 40/72  bone]
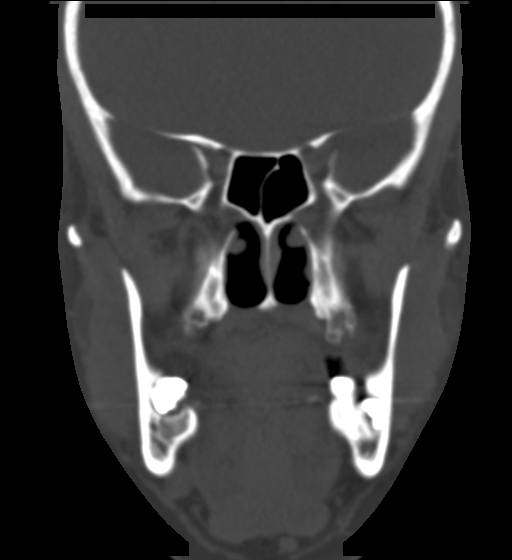

[Series 7: sagittal soft · sagittal · 0.31mm/px · 3 of 78 slices shown]
[im 26/78  bone]
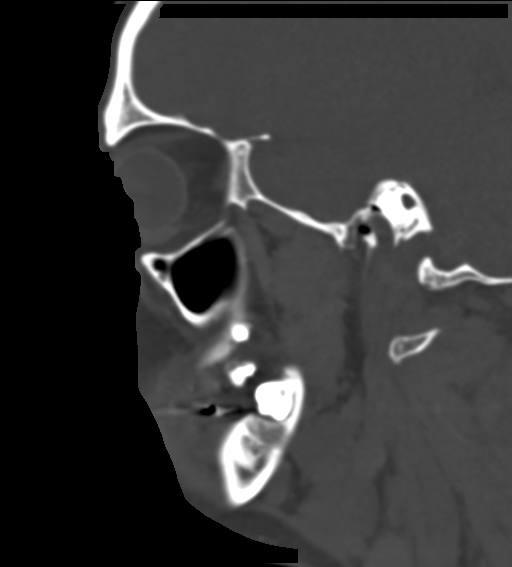
[im 39/78  bone]
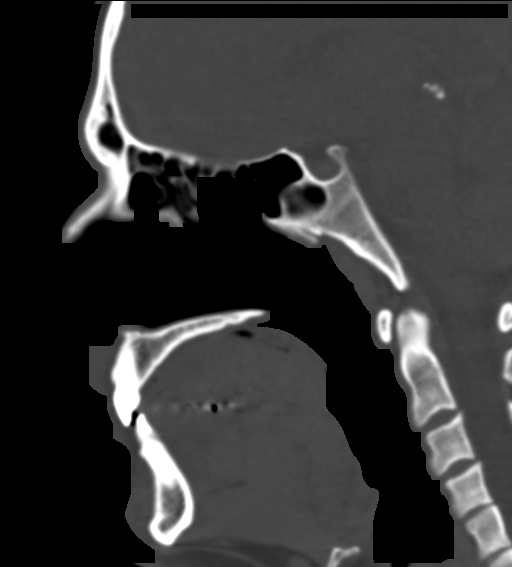
[im 52/78  bone]
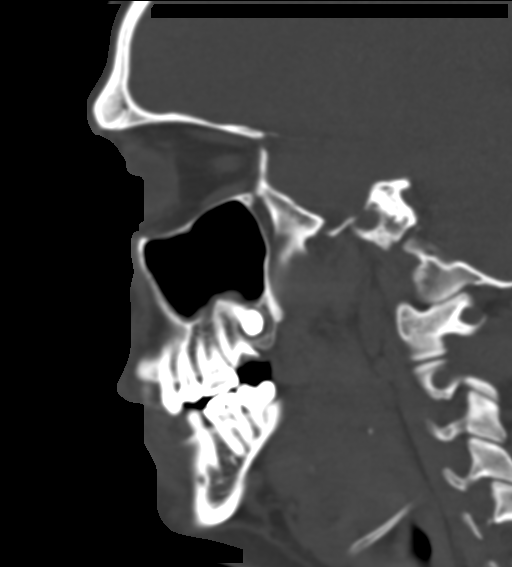

[16 of 47 positions shown; findings below may reference images not displayed]

FINDINGS: Osseous: No fracture or mandibular dislocation. No destructive
process.

Orbits: Negative. No traumatic or inflammatory finding.

Sinuses: Mild mucosal thickening in the maxillary sinuses and
scattered ethmoid air cells. No air-fluid levels. Mastoid air cells
are clear.

Soft tissues: Normal

Limited intracranial: Negative
IMPRESSION: Mild chronic sinusitis changes in the maxillary sinuses and
scattered ethmoid air cells. No acute sinusitis changes.

No acute bony abnormality.

## 2017-09-25 IMAGING — DX DG CHEST 2V
2 series · 2 of 2 positions shown · non-contrast
Comparison: None.

CLINICAL DATA: Productive cough and wheezing for 1 week.  Headache.

EXAM:
CHEST  2 VIEW

[chest pa]
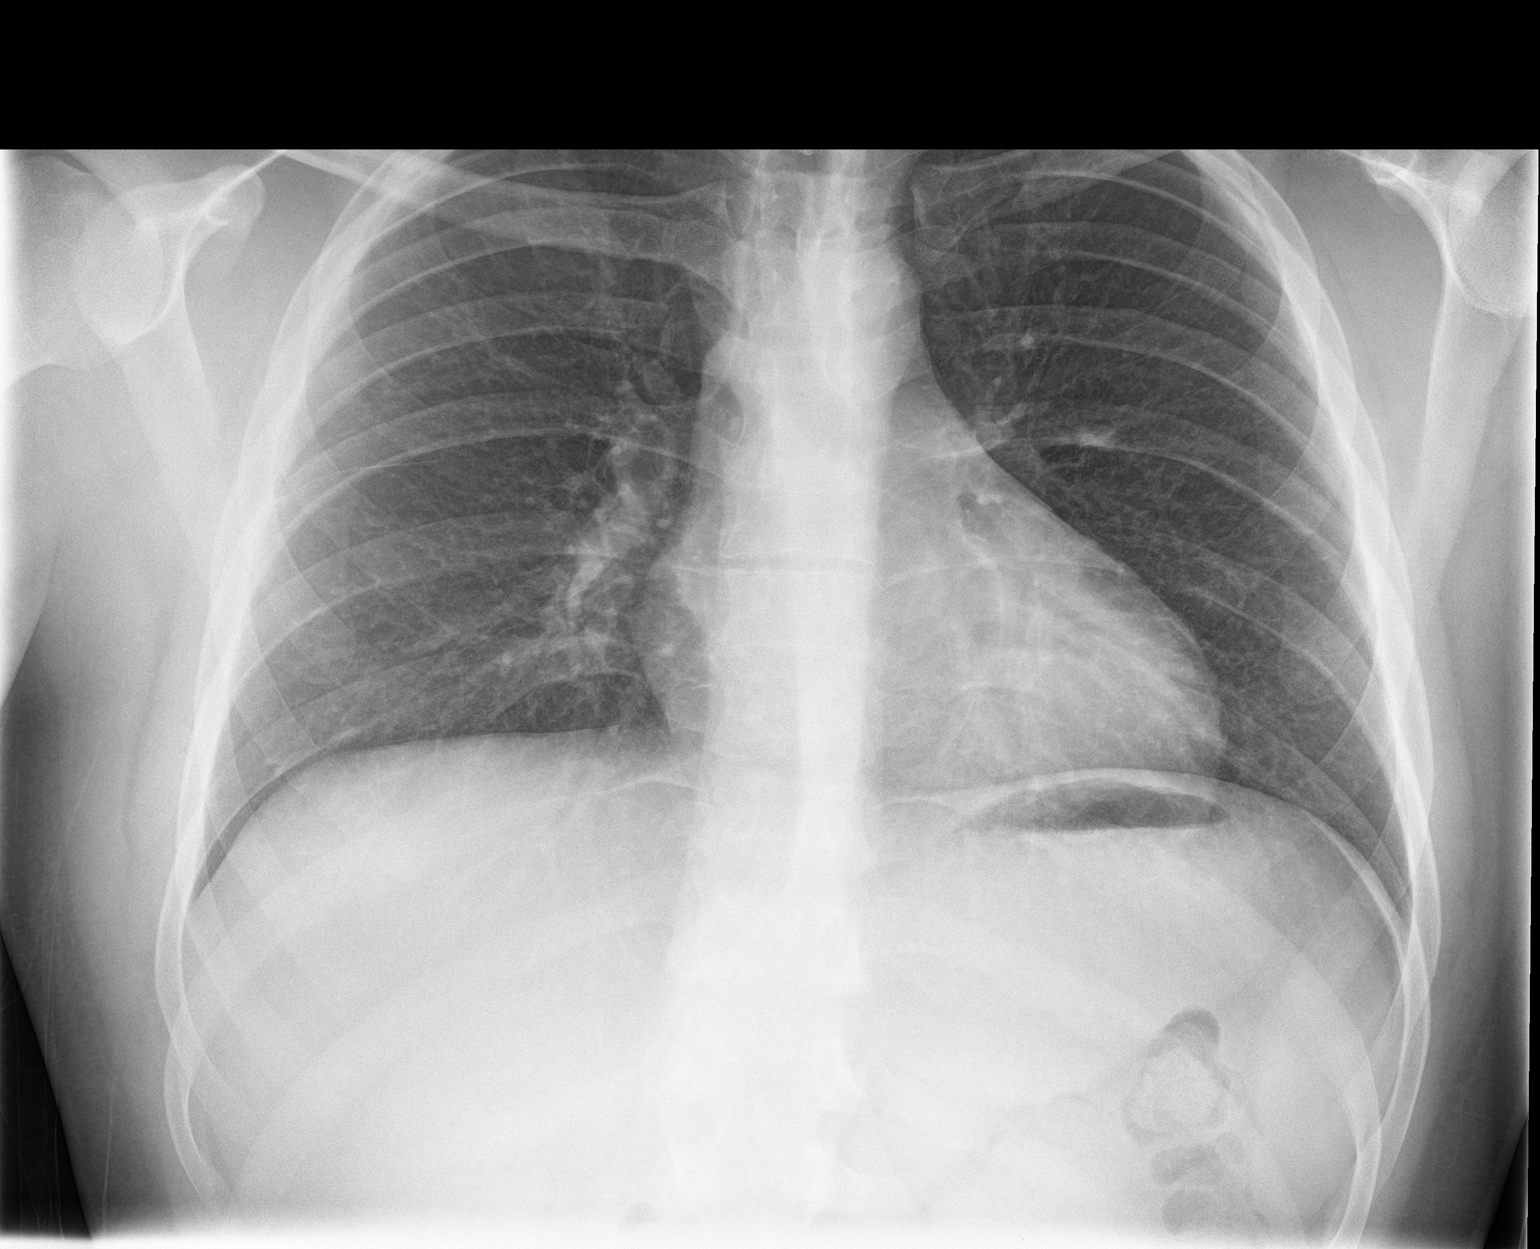

[chest lat]
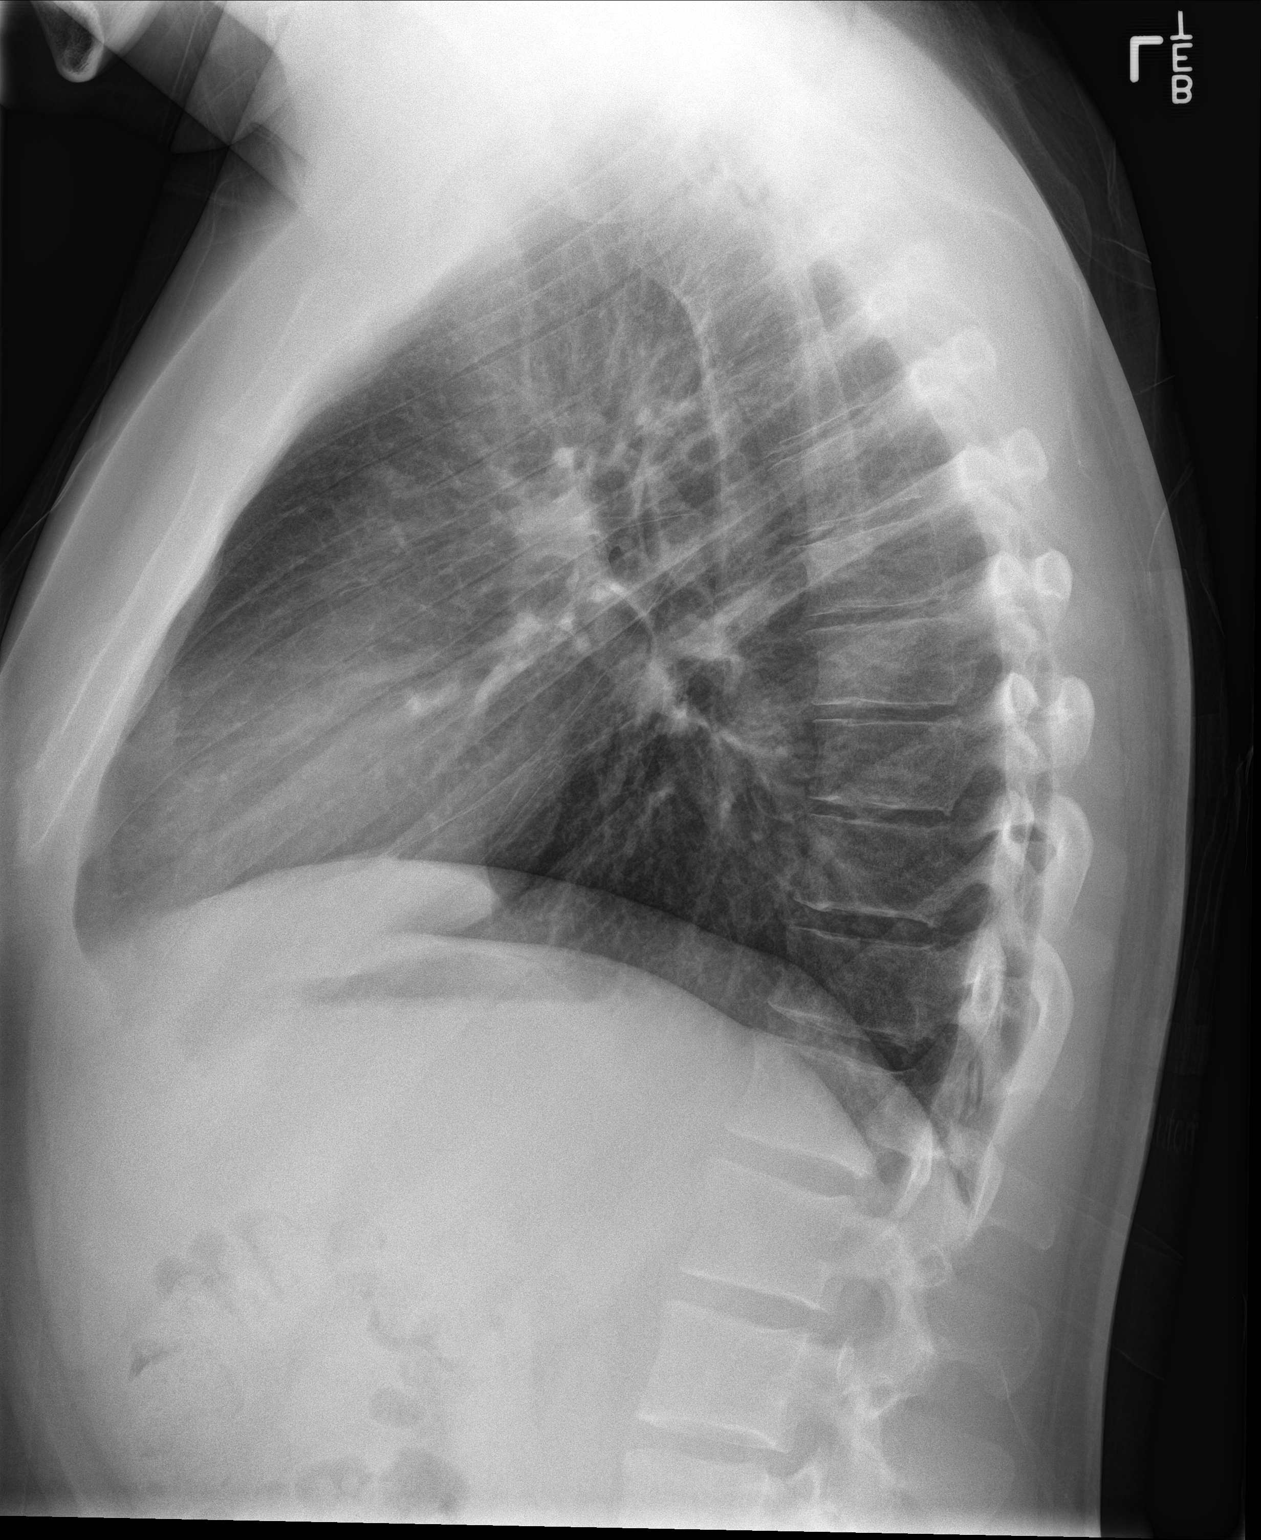

[2 of 2 positions shown; findings below may reference images not displayed]

FINDINGS: Heart and mediastinal contours are within normal limits. No focal
opacities or effusions. No acute bony abnormality.
IMPRESSION: No active cardiopulmonary disease.

## 2019-01-07 ENCOUNTER — Telehealth: Payer: Self-pay | Admitting: *Deleted

## 2019-01-07 DIAGNOSIS — Z20822 Contact with and (suspected) exposure to covid-19: Secondary | ICD-10-CM

## 2019-01-07 NOTE — Telephone Encounter (Signed)
Pt referred for testing by [facility] Department. Pt needs to be tested in order to go to rehab. No insurance. No PCM Pt called and scheduled for testing at [facility] site on 01/08/19 at 3:45 pm. Pt advised to wear a mask and remain in car at scheduled appt time. Pt verbalized understanding.

## 2019-01-08 ENCOUNTER — Other Ambulatory Visit: Payer: Self-pay

## 2019-01-08 DIAGNOSIS — Z20822 Contact with and (suspected) exposure to covid-19: Secondary | ICD-10-CM

## 2019-01-13 LAB — NOVEL CORONAVIRUS, NAA: SARS-CoV-2, NAA: NOT DETECTED

## 2020-01-05 ENCOUNTER — Other Ambulatory Visit: Payer: Self-pay

## 2020-01-05 ENCOUNTER — Encounter (HOSPITAL_COMMUNITY): Payer: Self-pay | Admitting: Emergency Medicine

## 2020-01-05 ENCOUNTER — Emergency Department (HOSPITAL_COMMUNITY): Payer: Self-pay

## 2020-01-05 DIAGNOSIS — T7840XA Allergy, unspecified, initial encounter: Secondary | ICD-10-CM | POA: Insufficient documentation

## 2020-01-05 DIAGNOSIS — S62324A Displaced fracture of shaft of fourth metacarpal bone, right hand, initial encounter for closed fracture: Secondary | ICD-10-CM | POA: Insufficient documentation

## 2020-01-05 DIAGNOSIS — Y93H2 Activity, gardening and landscaping: Secondary | ICD-10-CM | POA: Insufficient documentation

## 2020-01-05 DIAGNOSIS — L509 Urticaria, unspecified: Secondary | ICD-10-CM | POA: Insufficient documentation

## 2020-01-05 DIAGNOSIS — Y92197 Garden or yard of other specified residential institution as the place of occurrence of the external cause: Secondary | ICD-10-CM | POA: Insufficient documentation

## 2020-01-05 DIAGNOSIS — X500XXA Overexertion from strenuous movement or load, initial encounter: Secondary | ICD-10-CM | POA: Insufficient documentation

## 2020-01-05 DIAGNOSIS — F17210 Nicotine dependence, cigarettes, uncomplicated: Secondary | ICD-10-CM | POA: Insufficient documentation

## 2020-01-05 DIAGNOSIS — Y990 Civilian activity done for income or pay: Secondary | ICD-10-CM | POA: Insufficient documentation
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
## 2020-01-06 ENCOUNTER — Emergency Department (HOSPITAL_COMMUNITY)
Admission: EM | Admit: 2020-01-06 | Discharge: 2020-01-06 | Disposition: A | Discharge status: Home / Self Care | Payer: Self-pay | Attending: Emergency Medicine | Admitting: Emergency Medicine

## 2020-01-06 DIAGNOSIS — L509 Urticaria, unspecified: Secondary | ICD-10-CM

## 2020-01-06 DIAGNOSIS — T7840XA Allergy, unspecified, initial encounter: Secondary | ICD-10-CM

## 2020-01-06 DIAGNOSIS — S62324A Displaced fracture of shaft of fourth metacarpal bone, right hand, initial encounter for closed fracture: Secondary | ICD-10-CM

## 2020-01-06 MED ORDER — DIPHENHYDRAMINE HCL 50 MG/ML IJ SOLN
50.0000 mg | Freq: Once | INTRAMUSCULAR | Status: AC
  Administered 2020-01-06: 50 mg via INTRAVENOUS
  Filled 2020-01-06: qty 1

## 2020-01-06 MED ORDER — EPINEPHRINE 0.3 MG/0.3ML IJ SOAJ: 0.3000 mg | Freq: Once | INTRAMUSCULAR | Status: DC

## 2020-01-06 MED ORDER — FAMOTIDINE IN NACL 20-0.9 MG/50ML-% IV SOLN
20.0000 mg | Freq: Once | INTRAVENOUS | Status: AC
  Administered 2020-01-06: 20 mg via INTRAVENOUS
  Filled 2020-01-06: qty 50

## 2020-01-06 MED ORDER — METHYLPREDNISOLONE SODIUM SUCC 125 MG IJ SOLR
125.0000 mg | Freq: Once | INTRAMUSCULAR | Status: AC
  Administered 2020-01-06: 125 mg via INTRAVENOUS
  Filled 2020-01-06: qty 2

## 2020-01-06 MED ORDER — EPINEPHRINE 0.3 MG/0.3ML IJ SOAJ
INTRAMUSCULAR | Status: AC
  Filled 2020-01-06: qty 0.3

## 2020-01-06 MED ORDER — TRAMADOL HCL 50 MG PO TABS: 100.0000 mg | ORAL_TABLET | Freq: Four times a day (QID) | ORAL | 0 refills | Status: AC | PRN

## 2020-01-06 MED ORDER — ACETAMINOPHEN 325 MG PO TABS
650.0000 mg | ORAL_TABLET | Freq: Once | ORAL | Status: AC
  Administered 2020-01-06: 650 mg via ORAL
  Filled 2020-01-06: qty 2

## 2020-01-06 MED ORDER — IBUPROFEN 400 MG PO TABS
600.0000 mg | ORAL_TABLET | Freq: Once | ORAL | Status: AC
  Administered 2020-01-06: 600 mg via ORAL
  Filled 2020-01-06: qty 2

## 2020-01-06 MED ORDER — TRAMADOL HCL 50 MG PO TABS
100.0000 mg | ORAL_TABLET | Freq: Once | ORAL | Status: AC
  Administered 2020-01-06: 100 mg via ORAL
  Filled 2020-01-06: qty 2
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
## 2020-01-06 NOTE — Discharge Instructions (Addendum)
Elevate your hand, use ice packs to get the swelling down.  Take ibuprofen 600 mg plus acetaminophen 650 mg 4 times a day for pain.  You can take tramadol with it to help you sleep.  Please call Dr. Mort Sawyers office to have him evaluate your fractured metacarpal.  Leave the splint in place until Dr. Romeo Apple can evaluate you. Take Claritin or Zyrtec once a day for the next several days.  You can use Benadryl 50 mg for itching or rash not controlled by the Claritin or Zyrtec.  Take Pepcid AC over-the-counter twice a day for the next week, that will help prevent your itching and rash from returning.  I did not start you on steroids because it can delay healing of your fracture and increase your risk of infection if you should require surgery to repair the fracture in your hand.

## 2020-01-06 NOTE — ED Notes (Signed)
Pt states his lips are swelling and itching all over. EDP notified.

## 2020-01-07 ENCOUNTER — Other Ambulatory Visit: Payer: Self-pay

## 2020-01-07 ENCOUNTER — Encounter: Payer: Self-pay | Admitting: Orthopedic Surgery

## 2020-01-07 ENCOUNTER — Ambulatory Visit (INDEPENDENT_AMBULATORY_CARE_PROVIDER_SITE_OTHER): Payer: Self-pay | Admitting: Orthopedic Surgery

## 2020-01-07 VITALS — Ht 72.0 in | Wt 195.0 lb

## 2020-01-07 DIAGNOSIS — S62324A Displaced fracture of shaft of fourth metacarpal bone, right hand, initial encounter for closed fracture: Secondary | ICD-10-CM

## 2020-01-07 MED ORDER — IBUPROFEN 800 MG PO TABS: 800.0000 mg | ORAL_TABLET | Freq: Three times a day (TID) | ORAL | 1 refills | Status: AC | PRN

## 2020-01-07 MED ORDER — HYDROCODONE-ACETAMINOPHEN 5-325 MG PO TABS: 1.0000 | ORAL_TABLET | ORAL | 0 refills | Status: AC | PRN

## 2020-01-07 NOTE — Patient Instructions (Signed)
Metacarpal Fracture  A metacarpal fracture is a break (fracture) of a bone in the hand. Metacarpals are the bones that go from your knuckles to your wrist. You have five metacarpal bones in each hand. This fracture is usually caused by a fall or an injury that crushes the hand. This injury is diagnosed with medical history, a physical exam, or imaging tests, such as an X-ray. What are the signs or symptoms? Symptoms of this condition may include:  Pain.  Swelling.  Stiffness.  Bruising.  Inability to move a finger.  A finger that looks misshapen.  An abnormal bend or bump in the hand or finger (deformity). How is this treated? Treatment depends on how bad the injury is.  If your broken bone is still in place and did not move, you may need: ? To wear a splint or cast for several weeks. ? To have the broken finger taped to another finger next to it (buddy taping).  If the broken bone has pieces that moved and no longer line up, your doctor may: ? Do surgery to fix the bones into place with metal screws, plates, or wires. ? Move the bones back into position without surgery (closed reduction).  After your bones are put together, you will need to wear a splint or cast for several weeks. Treatment may also include:  Physical therapy after your cast or splint is removed.  Follow-up visits and X-rays to make sure you are healing. Follow these instructions at home: If you have a splint:  Wear the splint as told by your doctor. Remove it only as told by your doctor.  Loosen the splint if your fingers or toes tingle, become numb, or turn cold and blue.  Keep the splint clean.  If the splint is not waterproof: ? Do not let it get wet. ? Cover it with a watertight covering when you take a bath or a shower. If you have a cast:  Do not stick anything inside the cast to scratch your skin.  Check the skin around the cast every day. Tell your doctor about any concerns.  You may  put lotion on dry skin around the edges of the cast. Do not put lotion on the skin underneath the cast.  Keep the cast clean.  If the cast is not waterproof: ? Do not let it get wet. ? Cover it with a watertight covering when you take a bath or a shower. Activity  Do not lift or hold anything with your injured hand.  Return to your normal activities as told by your doctor. Ask your doctor what activities are safe for you.  Do exercises as told by your doctor. Driving  Do not drive or use heavy machinery while taking pain medicine.  Do not drive while wearing a cast or splint on a hand that you use for driving. Managing pain, stiffness, and swelling  If told, put ice on the injured area. Put ice only if you have a splint, not a cast. ? If you can remove your splint, remove it as told by your doctor. ? Put ice in a plastic bag. ? Place a towel between your skin and the bag. ? Leave the ice on for 20 minutes, 2-3 times a day.  Move your fingers often. This helps to prevent stiffness and swelling.  Raise the injured area above the level of your heart while you are sitting or lying down. General instructions  Do not put pressure on any part   of the cast or splint until it is fully hardened. This may take several hours.  Do not use any products that contain nicotine or tobacco, such as cigarettes and e-cigarettes. These can delay bone healing. If you need help quitting, ask your doctor.  Do not take baths, swim, or use a hot tub until your doctor says it is okay. Ask your doctor if you may take showers. You may only be allowed to take sponge baths.  Take over-the-counter and prescription medicines only as told by your doctor.  Keep all follow-up visits as told by your doctor. This is important. Contact a doctor if:  Your pain is worse.  You have redness, swelling, or pain that gets worse.  You have a fever.  There is a bad smell coming from your cast or splint. Get help  right away if:  You have very bad pain under the cast or in your hand.  You have trouble breathing.  The following happen, even after you loosen your splint: ? Your hand or fingernails turn blue or gray. ? Your hand feels cold or numb. Summary  A metacarpal fracture is a break (fracture) of a bone in the hand.  Treatment depends on how bad the injury is. You may need a cast or splint for a broken bone that did not move. You may need surgery for a very bad injury that moved the pieces of bone in your hand.  Follow your doctor's instructions for taking care of your injury after treatment. This information is not intended to replace advice given to you by your health care provider. Make sure you discuss any questions you have with your health care provider. Document Revised: 08/10/2017 Document Reviewed: 08/10/2017 [employer] Patient Education  2020 [employer]  
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
## 2020-02-02 DIAGNOSIS — S62324D Displaced fracture of shaft of fourth metacarpal bone, right hand, subsequent encounter for fracture with routine healing: Secondary | ICD-10-CM | POA: Insufficient documentation

## 2020-02-05 ENCOUNTER — Encounter: Payer: Self-pay | Admitting: Orthopedic Surgery

## 2020-02-05 ENCOUNTER — Ambulatory Visit: Payer: Self-pay | Admitting: Orthopedic Surgery

## 2022-03-05 ENCOUNTER — Emergency Department
Admission: EM | Admit: 2022-03-05 | Discharge: 2022-03-05 | Disposition: A | Discharge status: Home / Self Care | Payer: Self-pay | Attending: Emergency Medicine | Admitting: Emergency Medicine

## 2022-03-05 ENCOUNTER — Encounter: Payer: Self-pay | Admitting: Intensive Care

## 2022-03-05 ENCOUNTER — Other Ambulatory Visit: Payer: Self-pay

## 2022-03-05 DIAGNOSIS — Y900 Blood alcohol level of less than 20 mg/100 ml: Secondary | ICD-10-CM | POA: Insufficient documentation

## 2022-03-05 DIAGNOSIS — F1190 Opioid use, unspecified, uncomplicated: Secondary | ICD-10-CM

## 2022-03-05 DIAGNOSIS — E876 Hypokalemia: Secondary | ICD-10-CM | POA: Insufficient documentation

## 2022-03-05 DIAGNOSIS — F1199 Opioid use, unspecified with unspecified opioid-induced disorder: Secondary | ICD-10-CM | POA: Insufficient documentation

## 2022-03-05 LAB — COMPREHENSIVE METABOLIC PANEL
ALT: 13 U/L (ref 0–44)
AST: 24 U/L (ref 15–41)
Albumin: 4.6 g/dL (ref 3.5–5.0)
Alkaline Phosphatase: 118 U/L (ref 38–126)
Anion gap: 12 (ref 5–15)
BUN: 17 mg/dL (ref 6–20)
CO2: 25 mmol/L (ref 22–32)
Calcium: 9.4 mg/dL (ref 8.9–10.3)
Chloride: 98 mmol/L (ref 98–111)
Creatinine, Ser: 1.02 mg/dL (ref 0.61–1.24)
GFR, Estimated: >60 mL/min (ref >60)
Glucose, Bld: 143 mg/dL — ABNORMAL HIGH (ref 70–99)
Potassium: 3.3 mmol/L — ABNORMAL LOW (ref 3.5–5.1)
Sodium: 135 mmol/L (ref 135–145)
Total Bilirubin: 0.6 mg/dL (ref 0.3–1.2)
Total Protein: 8 g/dL (ref 6.5–8.1)

## 2022-03-05 LAB — URINE DRUG SCREEN, QUALITATIVE (ARMC ONLY)
Amphetamines, Ur Screen: POSITIVE — AB
Barbiturates, Ur Screen: NOT DETECTED
Benzodiazepine, Ur Scrn: NOT DETECTED
Cannabinoid 50 Ng, Ur ~~LOC~~: NOT DETECTED
Cocaine Metabolite,Ur ~~LOC~~: NOT DETECTED
MDMA (Ecstasy)Ur Screen: NOT DETECTED
Methadone Scn, Ur: NOT DETECTED
Opiate, Ur Screen: POSITIVE — AB
Phencyclidine (PCP) Ur S: NOT DETECTED
Tricyclic, Ur Screen: NOT DETECTED

## 2022-03-05 LAB — CBC
HCT: 41.2 % (ref 39.0–52.0)
Hemoglobin: 13.7 g/dL (ref 13.0–17.0)
MCH: 27.5 pg (ref 26.0–34.0)
MCHC: 33.3 g/dL (ref 30.0–36.0)
MCV: 82.7 fL (ref 80.0–100.0)
Platelets: 316 10*3/uL (ref 150–400)
RBC: 4.98 MIL/uL (ref 4.22–5.81)
RDW: 12.5 % (ref 11.5–15.5)
WBC: 9.2 10*3/uL (ref 4.0–10.5)
nRBC: 0 % (ref 0.0–0.2)

## 2022-03-05 LAB — ETHANOL: Alcohol, Ethyl (B): <10 mg/dL (ref <10)

## 2022-03-05 MED ORDER — LOPERAMIDE HCL 2 MG PO TABS: 2.0000 mg | ORAL_TABLET | Freq: Four times a day (QID) | ORAL | 0 refills | Status: AC | PRN

## 2022-03-05 MED ORDER — ONDANSETRON 4 MG PO TBDP: 4.0000 mg | ORAL_TABLET | Freq: Three times a day (TID) | ORAL | 0 refills | Status: AC | PRN

## 2022-03-05 NOTE — ED Provider Notes (Signed)
[facility] Provider Note    Event Date/Time   First MD Initiated Contact with Patient 03/05/22 2320     (approximate)   History   Drug Problem   HPI  Drew Hammond is a 31 y.o. male with past medical history of opiate use disorder who presents wanting to detox from fentanyl.  Patient tells me he would like to detox from fentanyl.  Uses about a gram that he injects daily.  Last used about an hour prior to arrival.  Patient has been on Subutex and Suboxone and methadone in the past.  Has had precipitated withdrawal from Suboxone in the past.    Past Medical History:  Diagnosis Date   IV drug abuse (HCC)    fentanyl    Patient Active Problem List   Diagnosis Date Noted   Displaced fracture of shaft of fourth metacarpal bone of right hand with routine healing 02/02/2020     Physical Exam  Triage Vital Signs: ED Triage Vitals  Enc Vitals Group     BP 03/05/22 1851 (!) 147/105     Pulse Rate 03/05/22 1851 96     Resp 03/05/22 1851 16     Temp 03/05/22 1851 97.8 F (36.6 C)     Temp Source 03/05/22 1851 Oral     SpO2 03/05/22 1851 100 %     Weight 03/05/22 1848 200 lb (90.7 kg)     Height 03/05/22 1848 6' (1.829 m)     Head Circumference --      Peak Flow --      Pain Score 03/05/22 1848 0     Pain Loc --      Pain Edu? --      Excl. in GC? --     Most recent vital signs: Vitals:   03/05/22 1851 03/05/22 2233  BP: (!) 147/105 (!) 135/94  Pulse: 96 87  Resp: 16 16  Temp: 97.8 F (36.6 C) 98.4 F (36.9 C)  SpO2: 100% 100%     General: Awake, no distress.  CV:  Good peripheral perfusion.  Resp:  Normal effort.  Abd:  No distention.  Neuro:             Awake, Alert, Oriented x 3  Other:     ED Results / Procedures / Treatments  Labs (all labs ordered are listed, but only abnormal results are displayed) Labs Reviewed  COMPREHENSIVE METABOLIC PANEL - Abnormal; Notable for the following components:      Result Value    Potassium 3.3 (*)    Glucose, Bld 143 (*)    All other components within normal limits  URINE DRUG SCREEN, QUALITATIVE ([facility] ONLY) - Abnormal; Notable for the following components:   Amphetamines, Ur Screen POSITIVE (*)    Opiate, Ur Screen POSITIVE (*)    All other components within normal limits  ETHANOL  CBC     EKG     RADIOLOGY    PROCEDURES:  Critical Care performed: No  Procedures   MEDICATIONS ORDERED IN ED: Medications - No data to display   IMPRESSION / MDM / ASSESSMENT AND PLAN / ED COURSE  I reviewed the triage vital signs and the nursing notes.                              Patient's presentation is most consistent with acute, uncomplicated illness.  Differential diagnosis includes, but is not limited  to, opiate use disorder, opiate withdrawal   Patient is a 31 year old male with opiate use disorder uses IV fentanyl presents with desire to detox from fentanyl.  Last use recently is not in withdrawal currently.  Has been on Subutex and Suboxone and methadone in the past.  He was told to come to the ER to seek detox.  His vitals are stable he looks well he is not actively in withdrawal with very low COWS score.  Explained to patient that unfortunately we do not have an inpatient detox here.  He is not a candidate to start Suboxone currently because this would certainly precipitate withdrawal especially with his heavy IV fentanyl use.  I recommended that he follow-up with [facility] tomorrow as they may be able to get him into an inpatient or outpatient detox and can start him on Suboxone.  Feel that it will be of little utility to prescribe Suboxone given it will likely be several days until he will have significant enough withdrawal do not have precipitated withdrawal.  Labs reviewed show mild hypokalemia with potassium 3.3 UDS positive for amphetamines and opiates.  I prescribe Zofran and Imodium for supportive treatment.  Patient is appropriate for discharge.       FINAL CLINICAL IMPRESSION(S) / ED DIAGNOSES   Final diagnoses:  Opioid use disorder     Rx / DC Orders   ED Discharge Orders          Ordered    ondansetron (ZOFRAN-ODT) 4 MG disintegrating tablet  Every 8 hours PRN        03/05/22 2332    loperamide (IMODIUM A-D) 2 MG tablet  4 times daily PRN        03/05/22 2332             Note:  This document was prepared using Dragon voice recognition software and may include unintentional dictation errors.   Georga Hacking, MD 03/05/22 [phone]

## 2022-03-05 NOTE — ED Triage Notes (Signed)
Patient reports he is here to detox from IV fentanyl use. Reports last using around 5:30pm.

## 2022-03-05 NOTE — ED Triage Notes (Signed)
Pt called for triage, no response. 

## 2022-03-05 NOTE — Discharge Instructions (Addendum)
Please follow-up with [facility] for treatment of your opiate use disorder.  I have prescribed you Zofran for nausea and vomiting and Imodium that you can take for diarrhea.

## 2022-03-05 NOTE — ED Triage Notes (Signed)
Pt called x's 3, for triage, no response. Pt not located outside

## 2022-03-06 ENCOUNTER — Ambulatory Visit (HOSPITAL_COMMUNITY)
Admission: EM | Admit: 2022-03-06 | Discharge: 2022-03-06 | Disposition: A | Discharge status: Home / Self Care | Payer: No Payment, Other | Attending: Psychiatry | Admitting: Psychiatry

## 2022-03-06 DIAGNOSIS — F1110 Opioid abuse, uncomplicated: Secondary | ICD-10-CM

## 2022-03-06 DIAGNOSIS — F1120 Opioid dependence, uncomplicated: Secondary | ICD-10-CM | POA: Insufficient documentation

## 2022-03-06 NOTE — Discharge Instructions (Signed)
F/u with out patient resource  . [address]   [address] [place] [address] [phone]

## 2022-03-06 NOTE — ED Triage Notes (Signed)
Pt presents to [facility] voluntarily, accompanied by his fiance and is here to detox from fentanyl use. Pt reports last using a few hours ago. Pt currently denies SI, HI, AVH.

## 2022-03-06 NOTE — ED Provider Notes (Signed)
[facility] Medical Screening Exam  Patient Name: Drew Hammond MRN: [id number] Date of Evaluation: 03/06/22 Chief Complaint:   Diagnosis:  Final diagnoses:  Fentanyl dependence (HCC)  Opioid abuse (HCC)    History of Present illness: Drew Hammond is a 31 y.o. male.  With a history of IV drug abuse, opioid use disorder.  Presented to [facility] voluntarily, stating he wanted to detox.  Per the patient he used fentanyl daily, and according to the patient he just use it before coming in.  Patient stated I just want to get clean.  Per the patient he is tried detox rehab a couple of places.  Most recent place was at [facility] risk admission according to patient he got kicked out because of smoking.  Patient also stated he was in [place] and got kicked out and a couple of other places.  Patient is accompanied by his fiance, patient stated I am ready to go home.  Discussed with patient it is up to him if he wants to stay patient stated no I do not want to stay.  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  Patient states he lives alone.  Face-to-face observation of the patient, patient is alert and oriented x 4, speech is low tone.  Mood is angry affect congruent with mood.  Patient is very fidgety [place] on his nails, maintain minimal to no eye contact.  Patient denies SI, HI, AVH, or paranoia.  Patient denies alcohol use, reports he smokes cigarettes. Patient state he didn't want to stay and asked to leave.   Recommend discharge and recommend patient resources so patient can follow up with outpatient.  Psychiatric Specialty Exam  Presentation  General Appearance:Casual  Eye Contact:Absent  Speech:Blocked  Speech Volume:Decreased  Handedness:Ambidextrous   Mood and Affect  Mood:Angry  Affect:Blunt   Thought Process  Thought Processes:Linear  Descriptions of Associations:Loose  Orientation:None  Thought  Content:WDL    Hallucinations:None  Ideas of Reference:None  Suicidal Thoughts:No  Homicidal Thoughts:No   Sensorium  Memory:Immediate Fair  Judgment:Fair  Insight:Fair   Executive Functions  Concentration:Fair  Attention Span:Fair  Recall:Fair  Fund of Knowledge:Fair  Language:Fair   Psychomotor Activity  Psychomotor Activity:Normal   Assets  Assets:Desire for Improvement   Sleep  Sleep:Fair  Number of hours: No data recorded  Nutritional Assessment (For OBS and [facility] admissions only) Has the patient had a weight loss or gain of 10 pounds or more in the last 3 months?: No    Physical Exam: Physical Exam HENT:     Head: Normocephalic.     Nose: Nose normal.  Cardiovascular:     Rate and Rhythm: Normal rate.  Pulmonary:     Effort: Pulmonary effort is normal.  Musculoskeletal:        General: Normal range of motion.     Cervical back: Normal range of motion.  Neurological:     General: No focal deficit present.     Mental Status: He is alert.  Psychiatric:        Mood and Affect: Mood normal.        Behavior: Behavior normal.        Thought Content: Thought content normal.        Judgment: Judgment normal.    Review of Systems  Constitutional: Negative.   HENT: Negative.    Eyes: Negative.   Respiratory: Negative.    Cardiovascular: Negative.  Gastrointestinal: Negative.   Genitourinary: Negative.   Musculoskeletal: Negative.   Skin: Negative.   Neurological: Negative.   Endo/Heme/Allergies: Negative.   Psychiatric/Behavioral:  The patient is nervous/anxious.    Blood pressure (!) 143/90, pulse 99, temperature 98.4 F (36.9 C), temperature source Oral, resp. rate 18, SpO2 97 %. There is no height or weight on file to calculate BMI.  Musculoskeletal: Strength & Muscle Tone: {desc; muscle tone:32375} Gait & Station: {PE GAIT ED ZJIR:67893} Patient leans: {Patient Leans:21022755}   [facility] MSE Discharge Disposition for Follow up and  Recommendations: {[facility] MSE Recommendations:24277}   Sindy Guadeloupe, NP 03/06/2022, 10:39 PM
